# Patient Record
Sex: Male | Born: 1980 | Race: Black or African American | Hispanic: No | Marital: Single | Smoking: Current every day smoker
Health system: Southern US, Community
[De-identification: ages and names within clinical notes are randomized; demographics above are authoritative.]

---

## 2008-11-28 ENCOUNTER — Emergency Department: Payer: Self-pay | Admitting: Unknown Physician Specialty

## 2011-09-21 ENCOUNTER — Emergency Department: Payer: Self-pay | Admitting: *Deleted

## 2011-09-30 ENCOUNTER — Emergency Department: Payer: Self-pay | Admitting: Emergency Medicine

## 2011-10-07 ENCOUNTER — Emergency Department: Payer: Self-pay | Admitting: Emergency Medicine

## 2011-10-08 ENCOUNTER — Emergency Department: Payer: Self-pay | Admitting: Emergency Medicine

## 2011-11-05 ENCOUNTER — Ambulatory Visit: Payer: Self-pay

## 2014-01-06 ENCOUNTER — Emergency Department: Payer: Self-pay | Admitting: Internal Medicine

## 2014-12-18 ENCOUNTER — Emergency Department
Admit: 2014-12-18 | Disposition: A | Discharge status: Home / Self Care | Payer: Self-pay | Admitting: Emergency Medicine

## 2015-02-21 ENCOUNTER — Other Ambulatory Visit: Payer: Self-pay | Admitting: Family Medicine

## 2015-02-21 DIAGNOSIS — M5412 Radiculopathy, cervical region: Secondary | ICD-10-CM

## 2015-02-27 ENCOUNTER — Ambulatory Visit
Admission: RE | Admit: 2015-02-27 | Discharge: 2015-02-27 | Disposition: A | Discharge status: Home / Self Care | Payer: BLUE CROSS/BLUE SHIELD | Source: Ambulatory Visit | Attending: Family Medicine | Admitting: Family Medicine

## 2015-02-27 DIAGNOSIS — M47896 Other spondylosis, lumbar region: Secondary | ICD-10-CM | POA: Insufficient documentation

## 2015-02-27 DIAGNOSIS — M5412 Radiculopathy, cervical region: Secondary | ICD-10-CM | POA: Diagnosis present

## 2015-02-27 DIAGNOSIS — M4802 Spinal stenosis, cervical region: Secondary | ICD-10-CM | POA: Diagnosis not present

## 2015-02-27 DIAGNOSIS — M4806 Spinal stenosis, lumbar region: Secondary | ICD-10-CM | POA: Insufficient documentation

## 2015-02-27 DIAGNOSIS — M47892 Other spondylosis, cervical region: Secondary | ICD-10-CM | POA: Insufficient documentation

## 2015-02-27 DIAGNOSIS — M542 Cervicalgia: Secondary | ICD-10-CM | POA: Diagnosis present

## 2015-02-27 DIAGNOSIS — M5022 Other cervical disc displacement, mid-cervical region: Secondary | ICD-10-CM | POA: Insufficient documentation

## 2015-08-29 ENCOUNTER — Emergency Department
Admission: EM | Admit: 2015-08-29 | Discharge: 2015-08-29 | Disposition: A | Discharge status: Home / Self Care | Payer: BLUE CROSS/BLUE SHIELD | Attending: Emergency Medicine | Admitting: Emergency Medicine

## 2015-08-29 ENCOUNTER — Encounter: Payer: Self-pay | Admitting: *Deleted

## 2015-08-29 DIAGNOSIS — M549 Dorsalgia, unspecified: Secondary | ICD-10-CM | POA: Diagnosis not present

## 2015-08-29 DIAGNOSIS — Z202 Contact with and (suspected) exposure to infections with a predominantly sexual mode of transmission: Secondary | ICD-10-CM | POA: Diagnosis not present

## 2015-08-29 DIAGNOSIS — G8929 Other chronic pain: Secondary | ICD-10-CM | POA: Diagnosis not present

## 2015-08-29 DIAGNOSIS — F17210 Nicotine dependence, cigarettes, uncomplicated: Secondary | ICD-10-CM | POA: Diagnosis not present

## 2015-08-29 MED ORDER — METHOCARBAMOL 500 MG PO TABS
1000.0000 mg | ORAL_TABLET | Freq: Once | ORAL | Status: AC
  Administered 2015-08-29: 1000 mg via ORAL
  Filled 2015-08-29: qty 2

## 2015-08-29 MED ORDER — METHYLPREDNISOLONE 4 MG PO TBPK: ORAL_TABLET | ORAL | Status: AC

## 2015-08-29 MED ORDER — DEXAMETHASONE SODIUM PHOSPHATE 10 MG/ML IJ SOLN
10.0000 mg | Freq: Once | INTRAMUSCULAR | Status: AC
  Administered 2015-08-29: 10 mg via INTRAMUSCULAR
  Filled 2015-08-29: qty 1

## 2015-08-29 MED ORDER — METHOCARBAMOL 750 MG PO TABS: 1500.0000 mg | ORAL_TABLET | Freq: Four times a day (QID) | ORAL | Status: AC

## 2015-08-29 NOTE — ED Notes (Signed)
States he is having a flare of back pain for the past week or so  So was told that his sex partner was treated for trich denies any sx's

## 2015-08-29 NOTE — ED Notes (Signed)
Pt reports having chronic back pain , pt reports complains of having increased back pain today

## 2015-08-29 NOTE — ED Provider Notes (Signed)
[facility] Emergency Department Provider Note ____________________________________________  Time seen: Approximately 3:14 PM  I have reviewed the triage vital signs and the nursing notes.   HISTORY  Chief Complaint Back Pain    HPI Scott Jarvis is a 34 y.o. male patient states she's had no flare of his back pain for 1 week now. Patient also requested STD testing secondary to a sexual partner being treated for Trichomonas. Patient denies any pain or complaints. Patient state has a history of chronic back pain which seems to respond to steroidal medications for about 6 months at a time.Patient is rating his pain as a 10 over 10. No palliative measures taken for this complaint. History reviewed. No pertinent past medical history.  There are no active problems to display for this patient.   History reviewed. No pertinent past surgical history.  Current Outpatient Rx  Name  Route  Sig  Dispense  Refill  . methocarbamol (ROBAXIN-750) 750 MG tablet   Oral   Take 2 tablets (1,500 mg total) by mouth 4 (four) times daily.   40 tablet   0   . methylPREDNISolone (MEDROL DOSEPAK) 4 MG TBPK tablet      Take Tapered dose as directed   21 tablet   0     Allergies Hydrocodone and Tramadol  No family history on file.  Social History Social History  Substance Use Topics  . Smoking status: Current Every Day Smoker -- 0.50 packs/day    Types: Cigarettes  . Smokeless tobacco: None  . Alcohol Use: 2.4 oz/week    4 Cans of beer per week    Review of Systems Constitutional: No fever/chills Eyes: No visual changes. ENT: No sore throat. Cardiovascular: Denies chest pain. Respiratory: Denies shortness of breath. Gastrointestinal: No abdominal pain.  No nausea, no vomiting.  No diarrhea.  No constipation. Genitourinary: Negative for dysuria. Musculoskeletal: Positive for chronic back pain  Skin: Negative for rash. Neurological: Negative for headaches, focal  weakness or numbness. 10-point ROS otherwise negative.  ____________________________________________   PHYSICAL EXAM:  VITAL SIGNS: ED Triage Vitals  Enc Vitals Group     BP 08/29/15 1430 134/68 mmHg     Pulse Rate 08/29/15 1430 98     Resp 08/29/15 1430 20     Temp 08/29/15 1430 98.6 F (37 C)     Temp Source 08/29/15 1430 Oral     SpO2 08/29/15 1430 98 %     Weight 08/29/15 1430 200 lb (90.719 kg)     Height 08/29/15 1430 5\' 10"  (1.778 m)     Head Cir --      Peak Flow --      Pain Score 08/29/15 1431 10     Pain Loc --      Pain Edu? --      Excl. in GC? --     Constitutional: Alert and oriented. Well appearing and in no acute distress. Eyes: Conjunctivae are normal. PERRL. EOMI. Head: Atraumatic. Nose: No congestion/rhinnorhea. Mouth/Throat: Mucous membranes are moist.  Oropharynx non-erythematous. Neck: No stridor.  No cervical spine tenderness to palpation.* Hematological/Lymphatic/Immunilogical: No cervical lymphadenopathy. Cardiovascular: Normal rate, regular rhythm. Grossly normal heart sounds.  Good peripheral circulation. Respiratory: Normal respiratory effort.  No retractions. Lungs CTAB. Gastrointestinal: Soft and nontender. No distention. No abdominal bruits. No CVA tenderness. Musculoskeletal: No lower extremity tenderness nor edema.  No joint effusions. Neurologic:  Normal speech and language. No gross focal neurologic deficits are appreciated. No gait instability. Skin:  Skin  is warm, dry and intact. No rash noted. Psychiatric: Mood and affect are normal. Speech and behavior are normal.  ____________________________________________   LABS (all labs ordered are listed, but only abnormal results are displayed)  Labs Reviewed - No data to display ____________________________________________  EKG   ____________________________________________  RADIOLOGY   ____________________________________________   PROCEDURES  Procedure(s) performed:  None  Critical Care performed: No  ____________________________________________   INITIAL IMPRESSION / ASSESSMENT AND PLAN / ED COURSE  Pertinent labs & imaging results that were available during my care of the patient were reviewed by me and considered in my medical decision making (see chart for details).  Chronic back pain. Patient given a prescription for prednisone and Flexeril. Patient advised to follow-up with the [facility] health department for evaluation STD complaint.   FINAL CLINICAL IMPRESSION(S) / ED DIAGNOSES  Final diagnoses:  Chronic back pain greater than 3 months duration  Possible exposure to STD      [name], PA-C 08/29/15 1531  Minna Antis, MD 08/30/15 2051

## 2015-12-22 IMAGING — CR DG LUMBAR SPINE 2-3V
1 series · 3 of 3 positions shown · non-contrast
Comparison: MRI 11/05/2011

CLINICAL DATA: Low back pain

EXAM:
LUMBAR SPINE - 2-3 VIEW

[Series 1: t lumbar spine ap · 0.14mm/px · 3 of 3 slices shown]
[im 1/3]
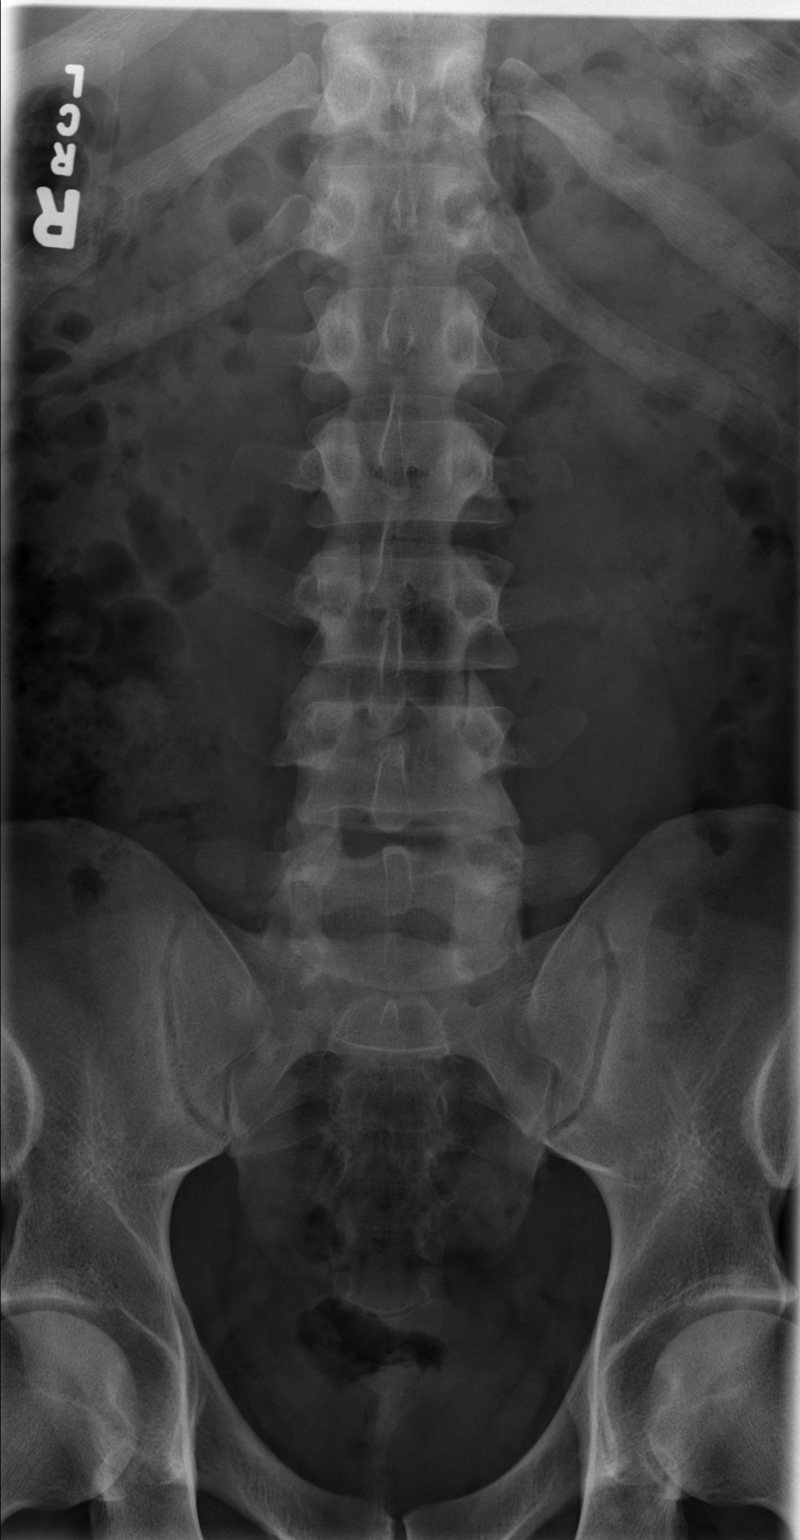
[im 2/3]
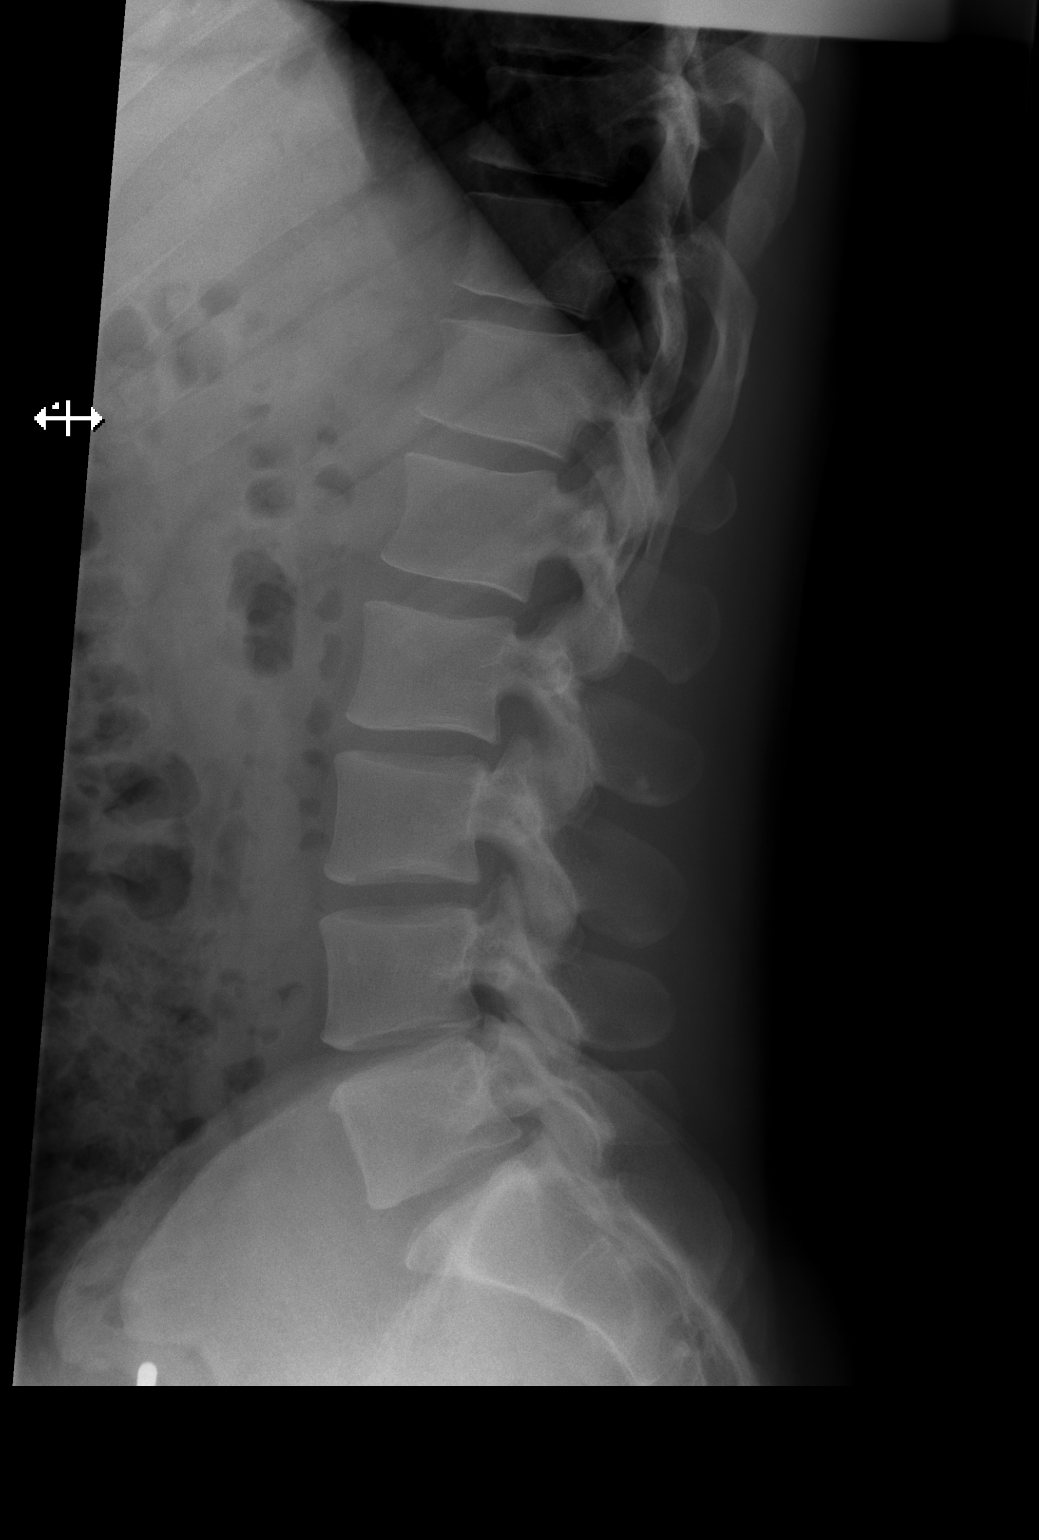
[im 3/3]
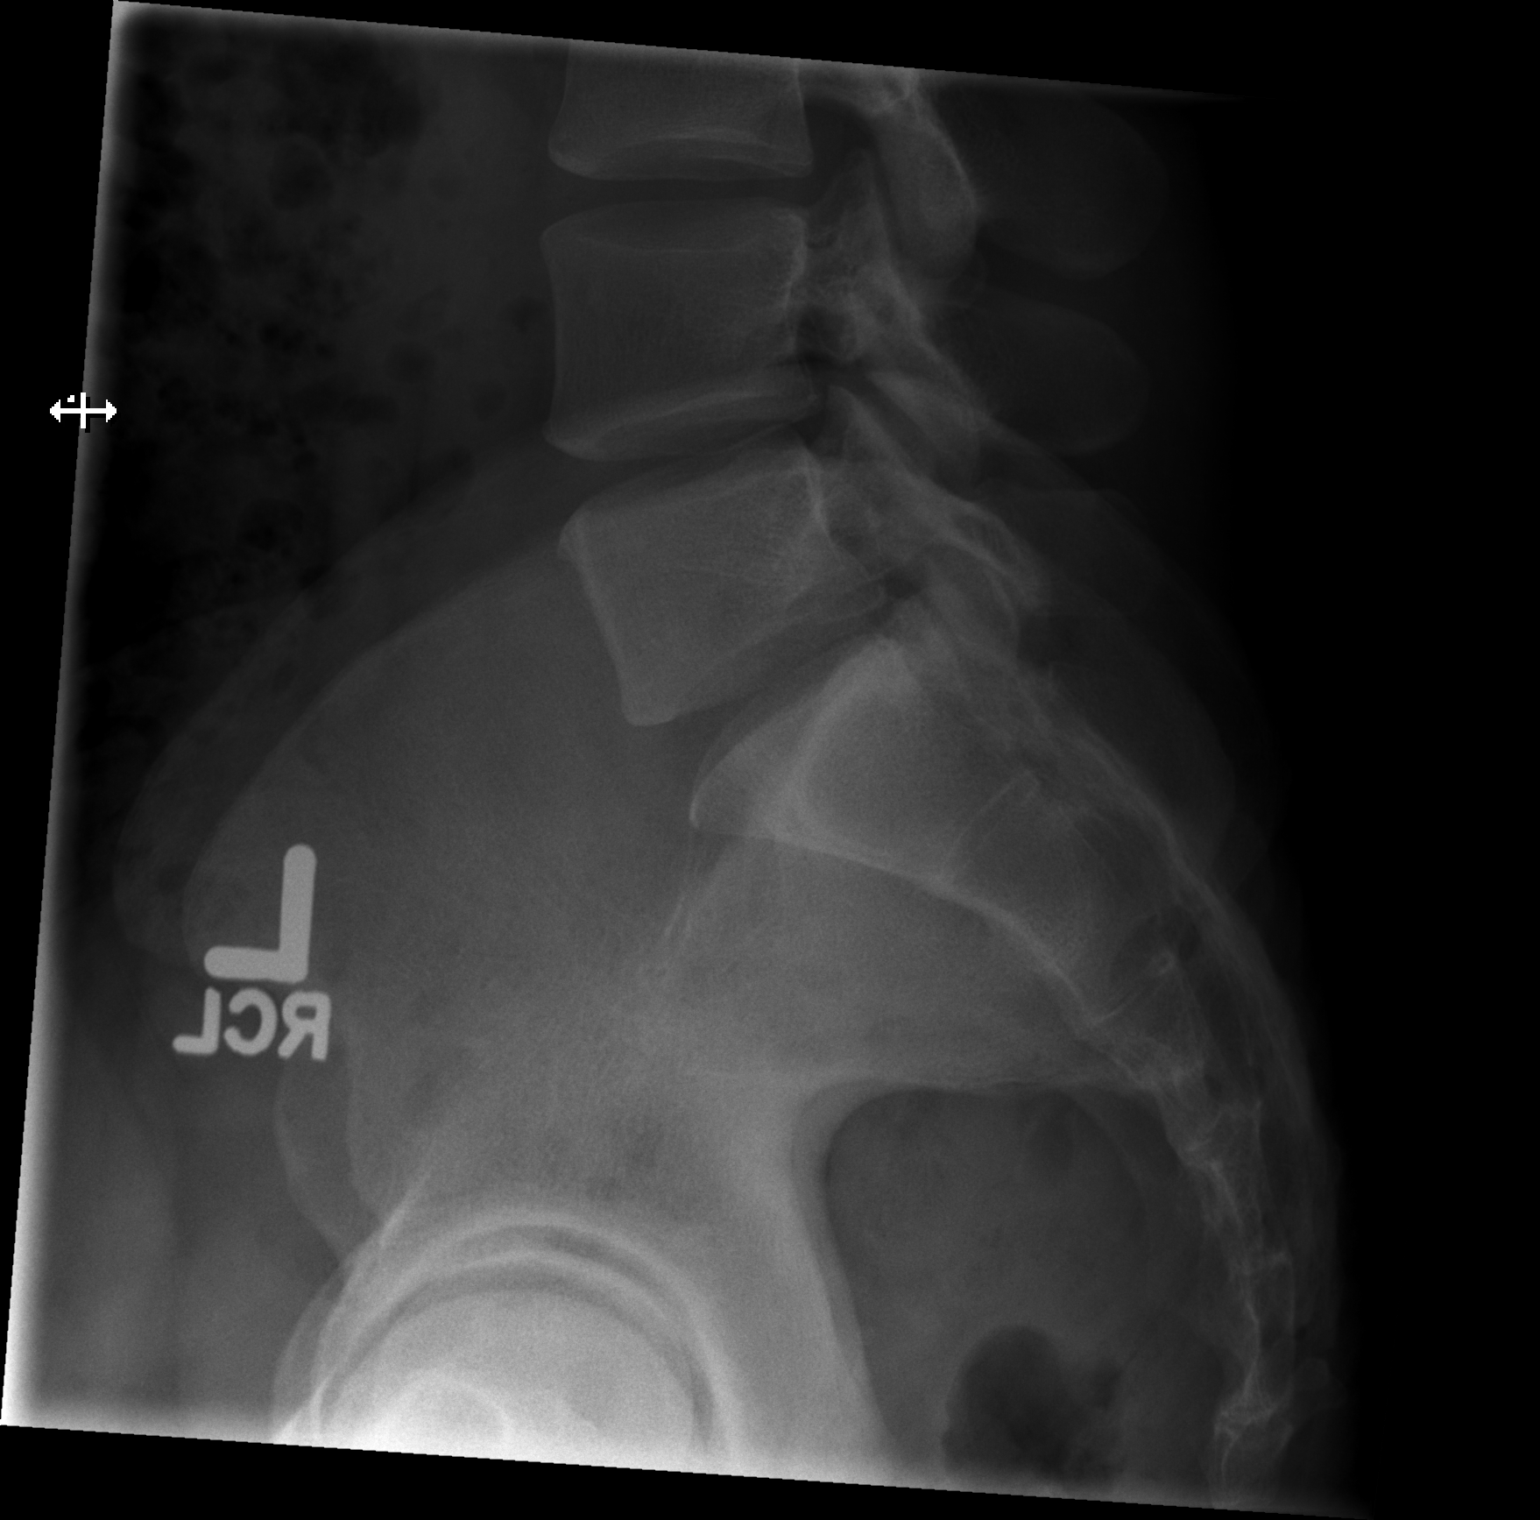

[3 of 3 positions shown; findings below may reference images not displayed]

FINDINGS: Five lumbar type vertebral bodies show normal alignment. There is
chronic disc space narrowing more advanced at L4-5 and L5-S1. No
pars defect or slippage. No focal osseous lesion. Sacroiliac joints
appear normal.
IMPRESSION: Chronic degenerative disc disease at L4-5 and L5-S1 with disc space
narrowing more pronounced at L4-5. No acute finding.

## 2023-03-30 ENCOUNTER — Other Ambulatory Visit: Payer: Self-pay

## 2023-03-30 ENCOUNTER — Encounter: Payer: Self-pay | Admitting: Emergency Medicine

## 2023-03-30 DIAGNOSIS — M545 Low back pain, unspecified: Secondary | ICD-10-CM | POA: Diagnosis not present

## 2023-03-30 DIAGNOSIS — M549 Dorsalgia, unspecified: Secondary | ICD-10-CM | POA: Diagnosis present

## 2023-03-30 NOTE — ED Triage Notes (Signed)
Pt presents ambulatory to triage via POV with complaints of lower back pain intermittently for the last 10 years. He notes injuring himself 10 years ago and has flare ups in his L4/L5 region. Tonight he rates the pain 10/10. A&Ox4 at this time. Denies urinary issues,  CP or SOB.

## 2023-03-31 ENCOUNTER — Emergency Department
Admission: EM | Admit: 2023-03-31 | Discharge: 2023-03-31 | Disposition: A | Discharge status: Home / Self Care | Payer: Managed Care, Other (non HMO) | Attending: Emergency Medicine | Admitting: Emergency Medicine

## 2023-03-31 DIAGNOSIS — M545 Low back pain, unspecified: Secondary | ICD-10-CM

## 2023-03-31 MED ORDER — KETOROLAC TROMETHAMINE 30 MG/ML IJ SOLN
15.0000 mg | Freq: Once | INTRAMUSCULAR | Status: AC
  Administered 2023-03-31: 15 mg via INTRAMUSCULAR
  Filled 2023-03-31: qty 1

## 2023-03-31 MED ORDER — DIAZEPAM 5 MG PO TABS
5.0000 mg | ORAL_TABLET | Freq: Once | ORAL | Status: AC
  Administered 2023-03-31: 5 mg via ORAL
  Filled 2023-03-31: qty 1

## 2023-03-31 MED ORDER — CYCLOBENZAPRINE HCL 10 MG PO TABS: 10.0000 mg | ORAL_TABLET | Freq: Three times a day (TID) | ORAL | 0 refills | Status: AC | PRN

## 2023-03-31 MED ORDER — IBUPROFEN 600 MG PO TABS: 600.0000 mg | ORAL_TABLET | Freq: Four times a day (QID) | ORAL | 0 refills | Status: AC | PRN

## 2023-03-31 NOTE — ED Notes (Signed)
Pt states he has someone that will pick him up after discharge for tx home.

## 2023-03-31 NOTE — Discharge Instructions (Addendum)
You were seen in the emergency department today for evaluation of your back pain. Fortunately, your exam here is reassuring. You can take Tylenol and Ibuprofen to help with your pain, unless there is another reason you should not take these. You can also try lidocaine patches that are available over-the-counter.   We will send a prescription for a muscle relaxer to your pharmacy. Do not drive or operate machinery when taking this medication.  I also sent a prescription for ibuprofen.  Follow-up with your primary care provider within a few days for reevaluation. Return to the ER for any worsening symptoms including numbness, tingling, weakness, bowel or bladder incontinence, or any other new or concerning symptoms.

## 2023-03-31 NOTE — ED Provider Notes (Signed)
[facility] Provider Note    Event Date/Time   First MD Initiated Contact with Patient 03/31/23 0036     (approximate)   History   Back Pain   HPI  Scott Jarvis is a 42 y.o. male presenting to the emergency department for evaluation of back pain.  Patient reports a history of chronic intermittent back pain for the last 10 years after a distant injury.  Reports occasional flareups.  Tonight, had worsening pain.  No bowel or bladder incontinence or difficulty emptying bladder.  No fevers or chills.  No chest pain or shortness of breath.  No focal numbness, tingling, weakness.  No recent trauma.     Physical Exam   Triage Vital Signs: ED Triage Vitals  Encounter Vitals Group     BP --      Systolic BP Percentile --      Diastolic BP Percentile --      Pulse Rate 03/30/23 2338 (!) 105     Resp 03/30/23 2338 18     Temp 03/30/23 2338 98.3 F (36.8 C)     Temp Source 03/30/23 2338 Oral     SpO2 03/30/23 2338 98 %     Weight 03/30/23 2337 225 lb (102.1 kg)     Height 03/30/23 2337 5\' 10"  (1.778 m)     Head Circumference --      Peak Flow --      Pain Score 03/30/23 2337 10     Pain Loc --      Pain Education --      Exclude from Growth Chart --     Most recent vital signs: Vitals:   03/30/23 2338  Pulse: (!) 105  Resp: 18  Temp: 98.3 F (36.8 C)  SpO2: 98%     General: Awake, interactive  CV:  Regular rate, good peripheral perfusion.  Resp:  Lungs clear, unlabored respirations.  Abd:  Soft, nondistended.  Back:  No focal midline tenderness, tenderness to palpation along the left lateral paraspinous musculature Neuro:  Symmetric facial movement, fluid speech, 5-5 strength of bilateral lower extremities with normal sensation and intact patellar reflexes   ED Results / Procedures / Treatments   Labs (all labs ordered are listed, but only abnormal results are displayed) Labs Reviewed - No data to display   EKG EKG independently  reviewed interpreted by myself (ER attending) demonstrates:    RADIOLOGY Imaging independently reviewed and interpreted by myself demonstrates:    PROCEDURES:  Critical Care performed: No  Procedures   MEDICATIONS ORDERED IN ED: Medications  ketorolac (TORADOL) 30 MG/ML injection 15 mg (15 mg Intramuscular Given 03/31/23 0119)  diazepam (VALIUM) tablet 5 mg (5 mg Oral Given 03/31/23 0119)     IMPRESSION / MDM / ASSESSMENT AND PLAN / ED COURSE  I reviewed the triage vital signs and the nursing notes.  Differential diagnosis includes, but is not limited to, musculoskeletal strain, no history of trauma suggestive of acute fracture, exam and history not suggestive of acute spinal cord pathology or other emergent process  Patient's presentation is most consistent with exacerbation of chronic illness.  42 year old male presenting with back pain without red flag features.  Treated symptomatically here with Toradol and Valium.  Reports improvement with ibuprofen in the past.  Prescription for this as well as a muscle relaxer sent to his pharmacy.  Strict return precautions provided.  Patient discharged stable condition.     FINAL CLINICAL IMPRESSION(S) / ED DIAGNOSES  Final diagnoses:  Acute left-sided low back pain, unspecified whether sciatica present     Rx / DC Orders   ED Discharge Orders          Ordered    ibuprofen (ADVIL) 600 MG tablet  Every 6 hours PRN        03/31/23 0212    cyclobenzaprine (FLEXERIL) 10 MG tablet  3 times daily PRN        03/31/23 0212             Note:  This document was prepared using Dragon voice recognition software and may include unintentional dictation errors.   Trinna Post, MD 03/31/23 [phone]
